# Patient Record
Sex: Female | Born: 1980 | Race: White | Hispanic: No | Smoking: Never smoker
Health system: Southern US, Community
[De-identification: ages and names within clinical notes are randomized; demographics above are authoritative.]

## PROBLEM LIST (undated history)

## (undated) DIAGNOSIS — F329 Major depressive disorder, single episode, unspecified: Secondary | ICD-10-CM

## (undated) DIAGNOSIS — F32A Depression, unspecified: Secondary | ICD-10-CM

## (undated) DIAGNOSIS — F419 Anxiety disorder, unspecified: Secondary | ICD-10-CM

## (undated) HISTORY — DX: Major depressive disorder, single episode, unspecified: F32.9

## (undated) HISTORY — DX: Anxiety disorder, unspecified: F41.9

## (undated) HISTORY — DX: Depression, unspecified: F32.A

---
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
## 1999-11-16 ENCOUNTER — Other Ambulatory Visit: Admission: RE | Admit: 1999-11-16 | Discharge: 1999-11-16 | Payer: Self-pay | Admitting: Obstetrics and Gynecology

## 2000-11-29 ENCOUNTER — Other Ambulatory Visit: Admission: RE | Admit: 2000-11-29 | Discharge: 2000-11-29 | Payer: Self-pay | Admitting: Obstetrics and Gynecology

## 2001-01-19 ENCOUNTER — Other Ambulatory Visit: Admission: RE | Admit: 2001-01-19 | Discharge: 2001-01-19 | Payer: Self-pay | Admitting: Obstetrics and Gynecology

## 2001-01-19 ENCOUNTER — Encounter (INDEPENDENT_AMBULATORY_CARE_PROVIDER_SITE_OTHER): Payer: Self-pay

## 2001-09-04 ENCOUNTER — Other Ambulatory Visit: Admission: RE | Admit: 2001-09-04 | Discharge: 2001-09-04 | Payer: Self-pay | Admitting: Obstetrics and Gynecology

## 2002-04-22 ENCOUNTER — Other Ambulatory Visit: Admission: RE | Admit: 2002-04-22 | Discharge: 2002-04-22 | Payer: Self-pay | Admitting: Obstetrics and Gynecology

## 2002-10-09 ENCOUNTER — Other Ambulatory Visit: Admission: RE | Admit: 2002-10-09 | Discharge: 2002-10-09 | Payer: Self-pay | Admitting: Obstetrics and Gynecology

## 2003-11-03 ENCOUNTER — Other Ambulatory Visit: Admission: RE | Admit: 2003-11-03 | Discharge: 2003-11-03 | Payer: Self-pay | Admitting: Obstetrics and Gynecology

## 2004-12-02 ENCOUNTER — Other Ambulatory Visit: Admission: RE | Admit: 2004-12-02 | Discharge: 2004-12-02 | Payer: Self-pay | Admitting: Obstetrics and Gynecology

## 2005-08-11 ENCOUNTER — Inpatient Hospital Stay (HOSPITAL_COMMUNITY): Admission: AD | Admit: 2005-08-11 | Discharge: 2005-08-13 | Payer: Self-pay | Admitting: Obstetrics and Gynecology

## 2006-03-23 ENCOUNTER — Ambulatory Visit: Payer: Self-pay | Admitting: Internal Medicine

## 2006-03-27 ENCOUNTER — Ambulatory Visit: Payer: Self-pay | Admitting: Internal Medicine

## 2006-05-01 ENCOUNTER — Ambulatory Visit: Payer: Self-pay | Admitting: Internal Medicine

## 2006-05-05 ENCOUNTER — Ambulatory Visit (HOSPITAL_COMMUNITY): Admission: RE | Admit: 2006-05-05 | Discharge: 2006-05-05 | Payer: Self-pay | Admitting: Internal Medicine

## 2006-05-19 ENCOUNTER — Other Ambulatory Visit: Admission: RE | Admit: 2006-05-19 | Discharge: 2006-05-19 | Payer: Self-pay | Admitting: Obstetrics and Gynecology

## 2007-11-15 IMAGING — NM NM HEPATO W/GB/PHARM/[PERSON_NAME]
2 series · 12 of 12 positions shown · non-contrast
Comparison: None available.

CLINICAL DATA: Epigastric pain, elevated liver function studies.  
 NUCLEAR MEDICINE HEPATOBILIARY SCAN WITH EJECTION FRACTION:
TECHNIQUE: Sequential abdominal images were obtained following intravenous injection of radiopharmaceutical.  Sequential images were continued following oral ingestion of 8 oz. Half-and-Half, and the gallbladder ejection fraction was calculated.
 Radiopharmaceutical:  5.1 mCi Fc-33m Choletec.

[Series 1: he hepatobiliary · 3.21mm/px · 6 of 60 frames shown (1 of 2)]
[frame 6/60]
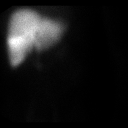
[frame 16/60]
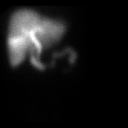
[frame 26/60]
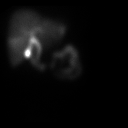
[frame 36/60]
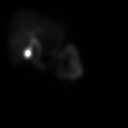
[frame 46/60]
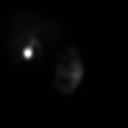
[frame 56/60]
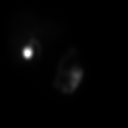

[Series 1: he hepatobiliary · 3.21mm/px · 6 of 60 frames shown (2 of 2)]
[frame 6/60]
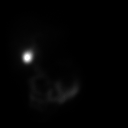
[frame 16/60]
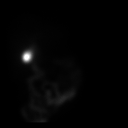
[frame 26/60]
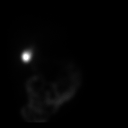
[frame 36/60]
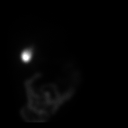
[frame 46/60]
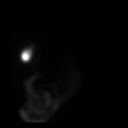
[frame 56/60]
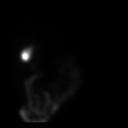

[12 of 12 positions shown; findings below may reference images not displayed]

FINDINGS: There is prompt visualization of the bile ducts, gallbladder, and small bowel.  Gallbladder ejection fraction is 28% 60 minutes following Half-and-Half.  Normal in considered greater than or equal to 50%.
IMPRESSION: 1.  Patent cystic and common bile ducts. 
 2.  Subnormal gallbladder ejection fraction.

## 2009-01-30 ENCOUNTER — Emergency Department (HOSPITAL_BASED_OUTPATIENT_CLINIC_OR_DEPARTMENT_OTHER): Admission: EM | Admit: 2009-01-30 | Discharge: 2009-01-30 | Payer: Self-pay | Admitting: Emergency Medicine

## 2011-02-18 NOTE — Assessment & Plan Note (Signed)
[facility]                           GASTROENTEROLOGY OFFICE NOTE   NAME:[name], [name]                    MRN:          [id number]  DATE:05/01/2006                            DOB:          1981-09-24    Please see my medical history and physical form for full details of this  visit.   ASSESSMENT:  Persistent abdominal pain in the epigastric area, some nausea,  vomiting and diarrhea.  It sounds most like irritable bowel syndrome.  Gallbladder ultrasound was previously unrevealing on March 27, 2006.  Her  diarrhea is a little bit less.  She is improved on glycopyrrolate but still  has problems that are episodic.  If she only eats cereal life is fine, she  says.  The vomiting has subsided.   PLAN:  HIDA scan with ejection fraction, continue glycopyrrolate.  If this  is unrevealing or unhelpful, then an upper GI endoscopy and flexible  sigmoidoscopy would probably be needed.  She understands the plan.                                   Iva Boop, MD, Clement[facility] Graham   CEG/MedQ  DD:  05/03/2006  DT:  05/03/2006  Job #:  [id number]   cc:   Kendra Cote, MD

## 2011-02-18 NOTE — Discharge Summary (Signed)
NAMESTEPHANIEMARIE, [name]             ACCOUNT NO.:  [id number]   MEDICAL RECORD NO.:  [id number]          PATIENT TYPE:  INP   LOCATION:  9110                          FACILITY:  [facility]   PHYSICIAN:  Zenaida Niece, M.D.DATE OF BIRTH:  10-19-1980   DATE OF ADMISSION:  08/11/2005  DATE OF DISCHARGE:  08/13/2005                                 DISCHARGE SUMMARY   ADMISSION DIAGNOSIS:  Intrauterine pregnancy at 38+ weeks.   DISCHARGE DIAGNOSES:  1.  Intrauterine pregnancy at 38+ weeks.  2.  Mild shoulder dystocia.   PROCEDURE:  On November 9, she had a spontaneous vaginal delivery.   HISTORY AND PHYSICAL:  This is a 30 year old white female, gravida 1, para 0  with an EGA of 38+ weeks who presents for elective induction by Dr.  Senaida Ores for favorable cervix and possible LGA baby. Prenatal care  complicated by size greater than dates with her last ultrasound at 36 weeks  with baby approximately 94th percentile.   PRENATAL LABORATORY DATA:  Blood type is A positive with a negative antibody  screen. RPR nonreactive. Rubella immune. Hepatitis B surface antigen  negative. HIV negative. Gonorrhea and Chlamydia negative. One-hour Glucola  is 123 and group B strep was negative.   PAST MEDICAL HISTORY:  Significant for human papilla virus and abnormal Pap  smears.   PAST SURGICAL HISTORY:  2004 wisdom tooth removal.   ALLERGIES:  PENICILLIN and LEVAQUIN.   PHYSICAL EXAMINATION:  VITAL SIGNS:  She was afebrile with stable vital  signs. Fetal heart tracing was reactive.  ABDOMEN:  Soft, gravid with an estimated fetal weight of 8 to 8-1/2 pounds.  Cervix was 2, 60, -1. Vertex and amniotomy revealed clear fluid.   HOSPITAL COURSE:  The patient was admitted and put on Pitocin and had  amniotomy performed for induction. She progressed into labor and received an  epidural. She progressed to complete, pushed well, and on the evening of  November 9 had a vaginal delivery of a viable female  infant with Apgars of 9  and 9, weight 7 pounds 8 ounces. There was a mild shoulder dystocia more  secondary to positioning that was relieved with McRoberts', and posterior  shoulder was pulled forward. Placenta delivered spontaneously after 20  minutes. She had a small laceration repaired with 2-0 Vicryl, and the  remainder of her perineum was intact. Placenta was sent for cord blood  collection. Estimated blood loss was 250 cc. Post partum, she had no  significant complications. Pre-delivery hemoglobin is 12.5; post-delivery is  10.9. On post partum day #2, she was stable for discharge home.   DISCHARGE INSTRUCTIONS:  Regular diet, pelvic rest. Follow up in six weeks.   MEDICATIONS:  Over-the-counter ibuprofen as needed and Percocet #20 one to  two p.o. q.4-6h. p.r.n. pain, and she is also given her discharge pamphlet.      Zenaida Niece, M.D.  Electronically Signed     TDM/MEDQ  D:  08/13/2005  T:  08/13/2005  Job:  [id number]

## 2013-07-08 ENCOUNTER — Ambulatory Visit (INDEPENDENT_AMBULATORY_CARE_PROVIDER_SITE_OTHER): Payer: 59 | Admitting: General Practice

## 2013-07-08 ENCOUNTER — Encounter: Payer: Self-pay | Admitting: General Practice

## 2013-07-08 VITALS — BP 111/59 | HR 71 | Temp 98.9°F | Ht 65.5 in | Wt 168.5 lb

## 2013-07-08 DIAGNOSIS — Z202 Contact with and (suspected) exposure to infections with a predominantly sexual mode of transmission: Secondary | ICD-10-CM

## 2013-07-08 DIAGNOSIS — Z01419 Encounter for gynecological examination (general) (routine) without abnormal findings: Secondary | ICD-10-CM

## 2013-07-08 DIAGNOSIS — Z124 Encounter for screening for malignant neoplasm of cervix: Secondary | ICD-10-CM

## 2013-07-08 DIAGNOSIS — Z0000 Encounter for general adult medical examination without abnormal findings: Secondary | ICD-10-CM

## 2013-07-08 LAB — POCT URINALYSIS DIPSTICK
Bilirubin, UA: NEGATIVE
Nitrite, UA: NEGATIVE
Protein, UA: NEGATIVE
Urobilinogen, UA: NEGATIVE
pH, UA: 5

## 2013-07-08 LAB — POCT UA - MICROSCOPIC ONLY: Casts, Ur, LPF, POC: NEGATIVE

## 2013-07-08 NOTE — Progress Notes (Signed)
  Subjective:    Patient ID: [name], female    DOB: [birth year], 32 y.o.   MRN: [id number]  HPI Patient presents today for annual examination. She reports recently separating for husband and concerned that he had sexual relations with other partners. She would like a STD panel performed today. Mashanda declines a manuel breast exam today, only wants pap.     Review of Systems  Constitutional: Negative for fever and chills.  HENT: Negative for neck pain and neck stiffness.   Respiratory: Negative for chest tightness and shortness of breath.   Cardiovascular: Negative for chest pain and palpitations.  Gastrointestinal: Negative for abdominal pain, diarrhea, constipation and blood in stool.  Genitourinary: Positive for vaginal discharge. Negative for dysuria, urgency, frequency, difficulty urinating and pelvic pain.       Whitish, fishy smelling discharge  Neurological: Negative for dizziness, weakness and headaches.       Objective:   Physical Exam  Constitutional: She is oriented to person, place, and time. She appears well-developed and well-nourished.  HENT:  Head: Normocephalic and atraumatic.  Right Ear: External ear normal.  Left Ear: External ear normal.  Mouth/Throat: Oropharynx is clear and moist.  Eyes: Conjunctivae and EOM are normal. Pupils are equal, round, and reactive to light.  Neck: Normal range of motion. Neck supple. No thyromegaly present.  Cardiovascular: Normal rate, regular rhythm, normal heart sounds and intact distal pulses.   Pulmonary/Chest: Effort normal and breath sounds normal. No respiratory distress. Right breast exhibits no inverted nipple, no mass, no nipple discharge, no skin change and no tenderness. Left breast exhibits no inverted nipple, no mass, no nipple discharge, no skin change and no tenderness. Breasts are symmetrical.  Abdominal: Soft. Bowel sounds are normal. She exhibits no distension. There is no tenderness.  Genitourinary:  Vagina normal and uterus normal. There is no rash, tenderness, lesion or injury on the right labia. There is no rash, tenderness, lesion or injury on the left labia. Uterus is not deviated, not enlarged, not fixed and not tender. Cervix exhibits no motion tenderness, no discharge and no friability. Right adnexum displays no mass, no tenderness and no fullness. Left adnexum displays no mass, no tenderness and no fullness. No erythema, tenderness or bleeding around the vagina. No foreign body around the vagina. No signs of injury around the vagina. No vaginal discharge found.  Lymphadenopathy:    She has no cervical adenopathy.  Neurological: She is alert and oriented to person, place, and time.  Skin: Skin is warm and dry.  Psychiatric: She has a normal mood and affect.          Assessment & Plan:  1. Annual physical exam  - POCT urinalysis dipstick - POCT UA - Microscopic Only - Pap, ThinPrep rflx HPV with CT/GC  2. Possible exposure to STD  - STD Panel (HBSAG,HIV,RPR) - Pap, ThinPrep rflx HPV with CT/GC -RTO if symptoms worsen or unresolved -discussed safe sex -Patient verbalized understanding -Coralie Keens, FNP-C

## 2013-07-08 NOTE — Patient Instructions (Signed)

## 2013-07-09 LAB — STD PANEL
HIV 1/O/2 Abs-Index Value: <1 (ref <1.00)
Hepatitis B Surface Ag: NEGATIVE
RPR: NONREACTIVE

## 2013-07-11 ENCOUNTER — Other Ambulatory Visit: Payer: Self-pay | Admitting: General Practice

## 2013-07-11 DIAGNOSIS — N390 Urinary tract infection, site not specified: Secondary | ICD-10-CM

## 2013-07-11 LAB — PAP, THINPREP RFLX HPV WITH CT/GC
Chlamydia, Nuc. Acid Amp: NEGATIVE
PAP Smear Comment: 0

## 2013-07-11 MED ORDER — NITROFURANTOIN MONOHYD MACRO 100 MG PO CAPS: 100.0000 mg | ORAL_CAPSULE | Freq: Two times a day (BID) | ORAL | Status: DC

## 2013-08-08 ENCOUNTER — Other Ambulatory Visit: Payer: Self-pay

## 2013-10-27 ENCOUNTER — Other Ambulatory Visit: Payer: Self-pay | Admitting: General Practice

## 2013-10-28 ENCOUNTER — Other Ambulatory Visit: Payer: Self-pay | Admitting: General Practice

## 2013-10-28 DIAGNOSIS — N390 Urinary tract infection, site not specified: Secondary | ICD-10-CM

## 2013-10-28 MED ORDER — NITROFURANTOIN MONOHYD MACRO 100 MG PO CAPS: 100.0000 mg | ORAL_CAPSULE | Freq: Two times a day (BID) | ORAL | Status: DC

## 2016-06-09 ENCOUNTER — Encounter: Payer: Self-pay | Admitting: Nurse Practitioner

## 2016-06-09 ENCOUNTER — Ambulatory Visit (INDEPENDENT_AMBULATORY_CARE_PROVIDER_SITE_OTHER): Payer: 59 | Admitting: Nurse Practitioner

## 2016-06-09 VITALS — BP 114/71 | HR 74 | Temp 98.5°F

## 2016-06-09 DIAGNOSIS — R300 Dysuria: Secondary | ICD-10-CM

## 2016-06-09 DIAGNOSIS — N3000 Acute cystitis without hematuria: Secondary | ICD-10-CM | POA: Diagnosis not present

## 2016-06-09 LAB — MICROSCOPIC EXAMINATION

## 2016-06-09 LAB — URINALYSIS, COMPLETE
Bilirubin, UA: NEGATIVE
GLUCOSE, UA: NEGATIVE
NITRITE UA: NEGATIVE
Protein, UA: NEGATIVE
Specific Gravity, UA: 1.02 (ref 1.005–1.030)
UUROB: 0.2 mg/dL (ref 0.2–1.0)
pH, UA: 6.5 (ref 5.0–7.5)

## 2016-06-09 MED ORDER — NITROFURANTOIN MONOHYD MACRO 100 MG PO CAPS: 100.0000 mg | ORAL_CAPSULE | Freq: Two times a day (BID) | ORAL | 0 refills | Status: AC

## 2016-06-09 NOTE — Progress Notes (Signed)
Subjective:    [name] is a 35 y.o. female who complains of dysuria, frequency, incomplete bladder emptying and urgency. She has had symptoms for 2 weeks. Patient also complains of vaginal discharge. Patient denies back pain, fever and stomach ache. Patient does not have a history of recurrent UTI. Patient does not have a history of pyelonephritis.   The following portions of the patient's history were reviewed and updated as appropriate: allergies, current medications, past family history, past medical history, past social history, past surgical history and problem list.  Review of Systems Pertinent items noted in HPI and remainder of comprehensive ROS otherwise negative.    Objective:    BP 114/71   Pulse 74   Temp 98.5 F (36.9 C) (Oral)  Back: negative, symmetric, no curvature. ROM normal. No CVA tenderness. Lungs: clear to auscultation bilaterally Heart: regular rate and rhythm, S1, S2 normal, no murmur, click, rub or gallop Abdomen: soft, non-tender; bowel sounds normal; no masses,  no organomegaly  Mild suprapubic pressure on palpation Laboratory:  Urine dipstick: trace for leukocyte esterase.   Micro exam: >5 WBCs per HPF.    Assessment:    Acute cystitis and UTI     Plan:   Meds ordered this encounter  Medications  . nitrofurantoin, macrocrystal-monohydrate, (MACROBID) 100 MG capsule    Sig: Take 1 capsule (100 mg total) by mouth 2 (two) times daily. 1 po BId    Dispense:  14 capsule    Refill:  0    Order Specific Question:   Supervising Provider    Answer:   Oswaldo DoneVINCENT, CAROL L [[id number]]     Take medication as prescribe Cotton underwear Take shower not bath Cranberry juice, yogurt Force fluids AZO over the counter X2 days Culture pending RTO prn  Mary-Margaret Daphine DeutscherMartin, FNP

## 2016-06-09 NOTE — Patient Instructions (Signed)
Take medication as prescribe Cotton underwear Take shower not bath Cranberry juice, yogurt Force fluids AZO over the counter X2 days Culture pending RTO prn  

## 2016-06-10 LAB — URINE CULTURE

## 2020-05-21 ENCOUNTER — Other Ambulatory Visit: Payer: Self-pay | Admitting: Obstetrics and Gynecology

## 2020-05-21 DIAGNOSIS — R928 Other abnormal and inconclusive findings on diagnostic imaging of breast: Secondary | ICD-10-CM

## 2024-04-09 ENCOUNTER — Other Ambulatory Visit: Payer: Self-pay | Admitting: Obstetrics and Gynecology

## 2024-04-09 DIAGNOSIS — N6489 Other specified disorders of breast: Secondary | ICD-10-CM

## 2024-06-04 ENCOUNTER — Ambulatory Visit
Admission: RE | Admit: 2024-06-04 | Discharge: 2024-06-04 | Disposition: A | Discharge status: Home / Self Care | Payer: Self-pay | Source: Ambulatory Visit | Attending: Obstetrics and Gynecology | Admitting: Obstetrics and Gynecology

## 2024-06-04 ENCOUNTER — Ambulatory Visit: Admission: RE | Admit: 2024-06-04 | Payer: Self-pay | Source: Ambulatory Visit

## 2024-06-04 DIAGNOSIS — N6489 Other specified disorders of breast: Secondary | ICD-10-CM
